# Patient Record
Sex: Male | Born: 1955 | Race: Black or African American | Hispanic: No | Marital: Married | Smoking: Never smoker
Health system: Southern US, Community
[De-identification: ages and names within clinical notes are randomized; demographics above are authoritative.]

## PROBLEM LIST (undated history)

## (undated) DIAGNOSIS — I10 Essential (primary) hypertension: Secondary | ICD-10-CM

## (undated) DIAGNOSIS — E7800 Pure hypercholesterolemia, unspecified: Secondary | ICD-10-CM

---

## 2004-12-17 ENCOUNTER — Emergency Department: Payer: Self-pay | Admitting: Emergency Medicine

## 2008-12-06 ENCOUNTER — Ambulatory Visit: Payer: Self-pay | Admitting: Unknown Physician Specialty

## 2017-09-05 DIAGNOSIS — J208 Acute bronchitis due to other specified organisms: Secondary | ICD-10-CM | POA: Diagnosis not present

## 2017-09-05 DIAGNOSIS — B9689 Other specified bacterial agents as the cause of diseases classified elsewhere: Secondary | ICD-10-CM | POA: Diagnosis not present

## 2017-10-06 DIAGNOSIS — I10 Essential (primary) hypertension: Secondary | ICD-10-CM | POA: Diagnosis not present

## 2017-10-06 DIAGNOSIS — E785 Hyperlipidemia, unspecified: Secondary | ICD-10-CM | POA: Diagnosis not present

## 2017-12-10 DIAGNOSIS — S46812A Strain of other muscles, fascia and tendons at shoulder and upper arm level, left arm, initial encounter: Secondary | ICD-10-CM | POA: Diagnosis not present

## 2018-02-14 DIAGNOSIS — E785 Hyperlipidemia, unspecified: Secondary | ICD-10-CM | POA: Diagnosis not present

## 2018-02-14 DIAGNOSIS — G4753 Recurrent isolated sleep paralysis: Secondary | ICD-10-CM | POA: Diagnosis not present

## 2018-02-14 DIAGNOSIS — I10 Essential (primary) hypertension: Secondary | ICD-10-CM | POA: Diagnosis not present

## 2018-02-14 DIAGNOSIS — Z23 Encounter for immunization: Secondary | ICD-10-CM | POA: Diagnosis not present

## 2018-04-10 DIAGNOSIS — N529 Male erectile dysfunction, unspecified: Secondary | ICD-10-CM | POA: Diagnosis not present

## 2018-04-10 DIAGNOSIS — Z1211 Encounter for screening for malignant neoplasm of colon: Secondary | ICD-10-CM | POA: Diagnosis not present

## 2018-04-10 DIAGNOSIS — Z23 Encounter for immunization: Secondary | ICD-10-CM | POA: Diagnosis not present

## 2018-04-10 DIAGNOSIS — Z0000 Encounter for general adult medical examination without abnormal findings: Secondary | ICD-10-CM | POA: Diagnosis not present

## 2018-05-17 DIAGNOSIS — N529 Male erectile dysfunction, unspecified: Secondary | ICD-10-CM | POA: Diagnosis not present

## 2018-06-05 DIAGNOSIS — G4733 Obstructive sleep apnea (adult) (pediatric): Secondary | ICD-10-CM | POA: Diagnosis not present

## 2018-12-10 ENCOUNTER — Other Ambulatory Visit: Payer: Self-pay

## 2018-12-10 ENCOUNTER — Emergency Department
Admission: EM | Admit: 2018-12-10 | Discharge: 2018-12-11 | Disposition: A | Discharge status: Home / Self Care | Payer: 59 | Attending: Emergency Medicine | Admitting: Emergency Medicine

## 2018-12-10 ENCOUNTER — Emergency Department: Payer: 59

## 2018-12-10 DIAGNOSIS — R0902 Hypoxemia: Secondary | ICD-10-CM | POA: Insufficient documentation

## 2018-12-10 DIAGNOSIS — Z20828 Contact with and (suspected) exposure to other viral communicable diseases: Secondary | ICD-10-CM | POA: Diagnosis not present

## 2018-12-10 DIAGNOSIS — I10 Essential (primary) hypertension: Secondary | ICD-10-CM | POA: Diagnosis not present

## 2018-12-10 DIAGNOSIS — R404 Transient alteration of awareness: Secondary | ICD-10-CM | POA: Diagnosis not present

## 2018-12-10 DIAGNOSIS — R000 Tachycardia, unspecified: Secondary | ICD-10-CM | POA: Diagnosis not present

## 2018-12-10 DIAGNOSIS — R55 Syncope and collapse: Secondary | ICD-10-CM | POA: Diagnosis not present

## 2018-12-10 DIAGNOSIS — W19XXXA Unspecified fall, initial encounter: Secondary | ICD-10-CM | POA: Diagnosis not present

## 2018-12-10 HISTORY — DX: Essential (primary) hypertension: I10

## 2018-12-10 HISTORY — DX: Pure hypercholesterolemia, unspecified: E78.00

## 2018-12-10 LAB — CBC
HCT: 47 % (ref 39.0–52.0)
Hemoglobin: 15.3 g/dL (ref 13.0–17.0)
MCH: 28.3 pg (ref 26.0–34.0)
MCHC: 32.6 g/dL (ref 30.0–36.0)
MCV: 86.9 fL (ref 80.0–100.0)
Platelets: 163 10*3/uL (ref 150–400)
RBC: 5.41 MIL/uL (ref 4.22–5.81)
RDW: 13.6 % (ref 11.5–15.5)
WBC: 8.4 10*3/uL (ref 4.0–10.5)
nRBC: 0 % (ref 0.0–0.2)

## 2018-12-10 LAB — TROPONIN I: Troponin I: <0.03 ng/mL (ref <0.03)

## 2018-12-10 LAB — BASIC METABOLIC PANEL
Anion gap: 15 (ref 5–15)
BUN: 17 mg/dL (ref 8–23)
CO2: 21 mmol/L — ABNORMAL LOW (ref 22–32)
Calcium: 9 mg/dL (ref 8.9–10.3)
Chloride: 104 mmol/L (ref 98–111)
Creatinine, Ser: 1.57 mg/dL — ABNORMAL HIGH (ref 0.61–1.24)
GFR calc Af Amer: 54 mL/min — ABNORMAL LOW (ref >60)
GFR calc non Af Amer: 47 mL/min — ABNORMAL LOW (ref >60)
Glucose, Bld: 194 mg/dL — ABNORMAL HIGH (ref 70–99)
Potassium: 3.5 mmol/L (ref 3.5–5.1)
Sodium: 140 mmol/L (ref 135–145)

## 2018-12-10 LAB — SARS CORONAVIRUS 2 BY RT PCR (HOSPITAL ORDER, PERFORMED IN ~~LOC~~ HOSPITAL LAB): SARS Coronavirus 2: NEGATIVE

## 2018-12-10 MED ORDER — SODIUM CHLORIDE 0.9 % IV BOLUS
1000.0000 mL | Freq: Once | INTRAVENOUS | Status: AC
  Administered 2018-12-10: 1000 mL via INTRAVENOUS

## 2018-12-10 NOTE — ED Provider Notes (Signed)
[facility] Emergency Department Provider Note ____________________________________________   First MD Initiated Contact with Patient 12/10/18 2048     (approximate)  I have reviewed the triage vital signs and the nursing notes.   HISTORY  Chief Complaint Near Syncope    HPI Robert Houston is a 63 y.o. male with PMH as noted below who presents with syncope, acute onset after the patient was doing sit ups and push-ups with his daughter.  The patient states that he did not feel any prodrome of lightheadedness, and simply awoke with the ambulance there.  He denies any prior history of this.  He states that he sometimes feels short of breath with exertion, but denies any chest pain.  He denies any shortness of breath at this time, or any cough or fever.  He has no vomiting, but states he did have some diarrhea earlier today.  EMS reported that the patient awoke after he got Narcan, but the patient denies any illicit drug use.  Past Medical History:  Diagnosis Date  . Hypercholesteremia   . Hypertension     There are no active problems to display for this patient.   History reviewed. No pertinent surgical history.  Prior to Admission medications   Not on File    Allergies Tomato  History reviewed. No pertinent family history.  Social History Social History   Tobacco Use  . Smoking status: Never Smoker  . Smokeless tobacco: Never Used  Substance Use Topics  . Alcohol use: Yes    Alcohol/week: 2.0 standard drinks    Types: 2 Shots of liquor per week  . Drug use: Not Currently    Review of Systems  Constitutional: No fever. Eyes: No visual changes. ENT: No sore throat. Cardiovascular: Denies chest pain. Respiratory: Denies shortness of breath. Gastrointestinal: No vomiting.  Positive for resolved diarrhea. Genitourinary: Negative for dysuria.  Musculoskeletal: Negative for back pain. Skin: Negative for rash. Neurological: Negative for  headache.   ____________________________________________   PHYSICAL EXAM:  VITAL SIGNS: ED Triage Vitals  Enc Vitals Group     BP 12/10/18 2040 123/66     Pulse Rate 12/10/18 2040 90     Resp 12/10/18 2040 18     Temp 12/10/18 2040 98.1 F (36.7 C)     Temp Source 12/10/18 2040 Oral     SpO2 12/10/18 2040 93 %     Weight 12/10/18 2035 238 lb (108 kg)     Height 12/10/18 2035  (1.727 m)     Head Circumference --      Peak Flow --      Pain Score 12/10/18 2035 0     Pain Loc --      Pain Edu? --      Excl. in GC? --     Constitutional: Alert and oriented. Well appearing and in no acute distress. Eyes: Conjunctivae are normal.  Head: Atraumatic. Nose: No congestion/rhinnorhea. Mouth/Throat: Mucous membranes are slightly dry.   Neck: Normal range of motion.  Cardiovascular: Normal rate, regular rhythm. Grossly normal heart sounds.  Good peripheral circulation. Respiratory: Normal respiratory effort.  No retractions. Lungs CTAB. Gastrointestinal: Soft and nontender. No distention.  Genitourinary: No flank tenderness. Musculoskeletal: No lower extremity edema.  Extremities warm and well perfused.  Neurologic:  Normal speech and language.  Motor intact in all extremities.  Normal coordination.  No gross focal neurologic deficits are appreciated.  Skin:  Skin is warm and dry. No rash noted. Psychiatric: Mood and  affect are normal. Speech and behavior are normal.  ____________________________________________   LABS (all labs ordered are listed, but only abnormal results are displayed)  Labs Reviewed  BASIC METABOLIC PANEL - Abnormal; Notable for the following components:      Result Value   CO2 21 (*)    Glucose, Bld 194 (*)    Creatinine, Ser 1.57 (*)    GFR calc non Af Amer 47 (*)    GFR calc Af Amer 54 (*)    All other components within normal limits  SARS CORONAVIRUS 2 (HOSPITAL ORDER, PERFORMED IN [facility])  CBC  TROPONIN I  URINALYSIS,  COMPLETE (UACMP) WITH MICROSCOPIC  URINE DRUG SCREEN, QUALITATIVE ([facility] ONLY)  CBG MONITORING, ED   ____________________________________________  EKG  ED ECG REPORT I, Dionne BucySebastian Claudetta Sallie, the attending physician, personally viewed and interpreted this ECG.  Date: 12/10/2018 EKG Time: 2037 Rate: 97 Rhythm: normal sinus rhythm QRS Axis: normal Intervals: normal ST/T Wave abnormalities: normal Narrative Interpretation: no evidence of acute ischemia  ____________________________________________  RADIOLOGY  CXR: No acute abnormality CT angio chest: Pending ____________________________________________   PROCEDURES  Procedure(s) performed: No  Procedures  Critical Care performed: No ____________________________________________   INITIAL IMPRESSION / ASSESSMENT AND PLAN / ED COURSE  Pertinent labs & imaging results that were available during my care of the patient were reviewed by me and considered in my medical decision making (see chart for details).  63 year old male with PMH as noted above presents with a syncopal episode after he was exercising with his daughter.  Per EMS, the patient awoke after he was given Narcan although he denies any drug use.  He states he is feeling better, and is asymptomatic at this time.  He denies any significant prodromal symptoms.  He states he sometimes feels short of breath with exercise but has no respiratory symptoms at this time.  I reviewed the past medical records in epic; the patient has no ED visits or admissions here previously.  On exam he is well-appearing.  His vital signs are normal except for borderline low O2 saturation in the low 90s on room air.  The remainder the exam is unremarkable.  Neuro exam is nonfocal.  His EKG is normal.  Overall presentation is consistent with vasovagal near syncope although the lack of a prodrome concerns me somewhat.  The EKG has no findings to suggest cardiac etiology.  It is also possible  that the patient was somewhat dehydrated from having diarrhea earlier, this was exacerbated by exercise.  We will obtain lab work-up, give fluids, and reassess.  ----------------------------------------- 10:27 PM on 12/10/2018 -----------------------------------------  When I initially saw the patient he was on 2 L of oxygen turned on by EMS.  I turned this off and observe the patient for a few minutes and his O2 saturation stayed in the low to mid 90s.  However the saturations subsequently started to have dips into the high 80s repeatedly and with a good waveform.  The patient is fully awake and alert.  He still has no active symptoms.  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  His symptoms are not really consistent with COVID-19, but given the hypoxia I will test him.  We will also obtain a chest x-ray and reassess.  If he continues to have recurrent hypoxia, especially in the context of an exertional syncope, I will consider admitting him.  ----------------------------------------- 11:18 PM on  12/10/2018 -----------------------------------------  Chest x-ray shows no acute abnormality.  Given the combination of syncope with persistent mild hypoxia, I will obtain a CT chest to rule out PE.  I discussed with the patient that if his O2 saturation remains intermittently low, I would strongly recommend admission for further evaluation especially in the context of exertional syncope.  Otherwise we cannot rule out possible cardiopulmonary etiology of his symptoms today.  The patient states he would like to think about it and wait for the results of the additional tests.  He is pending CT chest and COVID-19 test at this time.  I signed the patient out to the oncoming physician Dr. Derrill Kay.   __________________________________  Cathlean Marseilles was evaluated in Emergency Department on 12/10/2018 for the symptoms  described in the history of present illness. He was evaluated in the context of the global COVID-19 pandemic, which necessitated consideration that the patient might be at risk for infection with the SARS-CoV-2 virus that causes COVID-19. Institutional protocols and algorithms that pertain to the evaluation of patients at risk for COVID-19 are in a state of rapid change based on information released by regulatory bodies including the CDC and federal and state organizations. These policies and algorithms were followed during the patient's care in the ED.   ____________________________________________   FINAL CLINICAL IMPRESSION(S) / ED DIAGNOSES  Final diagnoses:  Syncope, unspecified syncope type  Hypoxia      NEW MEDICATIONS STARTED DURING THIS VISIT:  New Prescriptions   No medications on file     Note:  This document was prepared using Dragon voice recognition software and may include unintentional dictation errors.    Dionne Bucy, MD 12/10/18 [phone]

## 2018-12-10 NOTE — ED Triage Notes (Signed)
Pt arrived via [facility] c/o syncope. Pt states that he did some sit ups and push ups and was exercising when all of a sudden he passed out and does not remember anything. EMS states that they gave him narcan and he then became more alert and oriented. Pt pleasant and alert on arrival.

## 2018-12-10 NOTE — ED Notes (Signed)
Pt oxygen saturation reading 85-88% on RA. Pt sitting up in bed alert and oriented. EDP made aware.

## 2018-12-11 ENCOUNTER — Encounter: Payer: Self-pay | Admitting: Radiology

## 2018-12-11 ENCOUNTER — Emergency Department: Payer: 59

## 2018-12-11 DIAGNOSIS — R4182 Altered mental status, unspecified: Secondary | ICD-10-CM | POA: Diagnosis not present

## 2018-12-11 DIAGNOSIS — R69 Illness, unspecified: Secondary | ICD-10-CM | POA: Diagnosis not present

## 2018-12-11 DIAGNOSIS — I639 Cerebral infarction, unspecified: Secondary | ICD-10-CM | POA: Diagnosis not present

## 2018-12-11 LAB — URINALYSIS, COMPLETE (UACMP) WITH MICROSCOPIC
Bacteria, UA: NONE SEEN
Bilirubin Urine: NEGATIVE
Glucose, UA: NEGATIVE mg/dL
Hgb urine dipstick: NEGATIVE
Ketones, ur: 5 mg/dL — AB
Leukocytes,Ua: NEGATIVE
Nitrite: NEGATIVE
Protein, ur: NEGATIVE mg/dL
Specific Gravity, Urine: 1.015 (ref 1.005–1.030)
Squamous Epithelial / HPF: NONE SEEN (ref 0–5)
pH: 5 (ref 5.0–8.0)

## 2018-12-11 LAB — URINE DRUG SCREEN, QUALITATIVE (ARMC ONLY)
Amphetamines, Ur Screen: NOT DETECTED
Barbiturates, Ur Screen: NOT DETECTED
Benzodiazepine, Ur Scrn: NOT DETECTED
Cannabinoid 50 Ng, Ur ~~LOC~~: NOT DETECTED
Cocaine Metabolite,Ur ~~LOC~~: NOT DETECTED
MDMA (Ecstasy)Ur Screen: NOT DETECTED
Methadone Scn, Ur: NOT DETECTED
Opiate, Ur Screen: NOT DETECTED
Phencyclidine (PCP) Ur S: NOT DETECTED
Tricyclic, Ur Screen: NOT DETECTED

## 2018-12-11 MED ORDER — IOHEXOL 350 MG/ML SOLN
75.0000 mL | Freq: Once | INTRAVENOUS | Status: AC | PRN
  Administered 2018-12-11: 75 mL via INTRAVENOUS

## 2018-12-11 NOTE — ED Notes (Signed)
ED Provider at bedside. 

## 2018-12-11 NOTE — ED Notes (Signed)
Patient transported to CT 

## 2018-12-11 NOTE — Discharge Instructions (Addendum)
As we discussed it is very important that you follow up with your primary care physician. It is important that they follow up with you to make sure that you are feeling okay and do not have any further syncopal episodes, weakness or shortness of breath. Please seek medical attention for any high fevers, chest pain, shortness of breath, change in behavior, persistent vomiting, bloody stool or any other new or concerning symptoms.

## 2018-12-11 NOTE — ED Provider Notes (Signed)
CT angio PE was negative. COVID negative. Patient did have a couple further episodes of hypoxia into the upper 80s. However it would only last a second or 2 before rising back into the 90s. Did get ambulatory saturation without levels dropping below 90s. Patient continued to feel better. Did request discharge. At this time I think it is reasonable. Did however discuss with patient importance of close PCP follow up.   Phineas Semen, MD 12/11/18 [phone]

## 2018-12-11 NOTE — ED Notes (Signed)
Walked with pt around the room while monitoring pt's pulse/ox as requested by Dr. Derrill Kay. Reported observations to Dr. Derrill Kay.

## 2018-12-13 DIAGNOSIS — R0902 Hypoxemia: Secondary | ICD-10-CM | POA: Diagnosis not present

## 2018-12-13 DIAGNOSIS — G9340 Encephalopathy, unspecified: Secondary | ICD-10-CM | POA: Diagnosis not present

## 2018-12-27 DIAGNOSIS — R569 Unspecified convulsions: Secondary | ICD-10-CM | POA: Diagnosis not present

## 2018-12-27 DIAGNOSIS — R0902 Hypoxemia: Secondary | ICD-10-CM | POA: Diagnosis not present

## 2018-12-27 DIAGNOSIS — G9340 Encephalopathy, unspecified: Secondary | ICD-10-CM | POA: Diagnosis not present

## 2020-06-25 IMAGING — CR PORTABLE CHEST - 1 VIEW
1 series · 1 of 1 positions shown · non-contrast
Comparison: None.

CLINICAL DATA: Syncope and hypoxia today.

EXAM:
PORTABLE CHEST 1 VIEW

[chest ap]
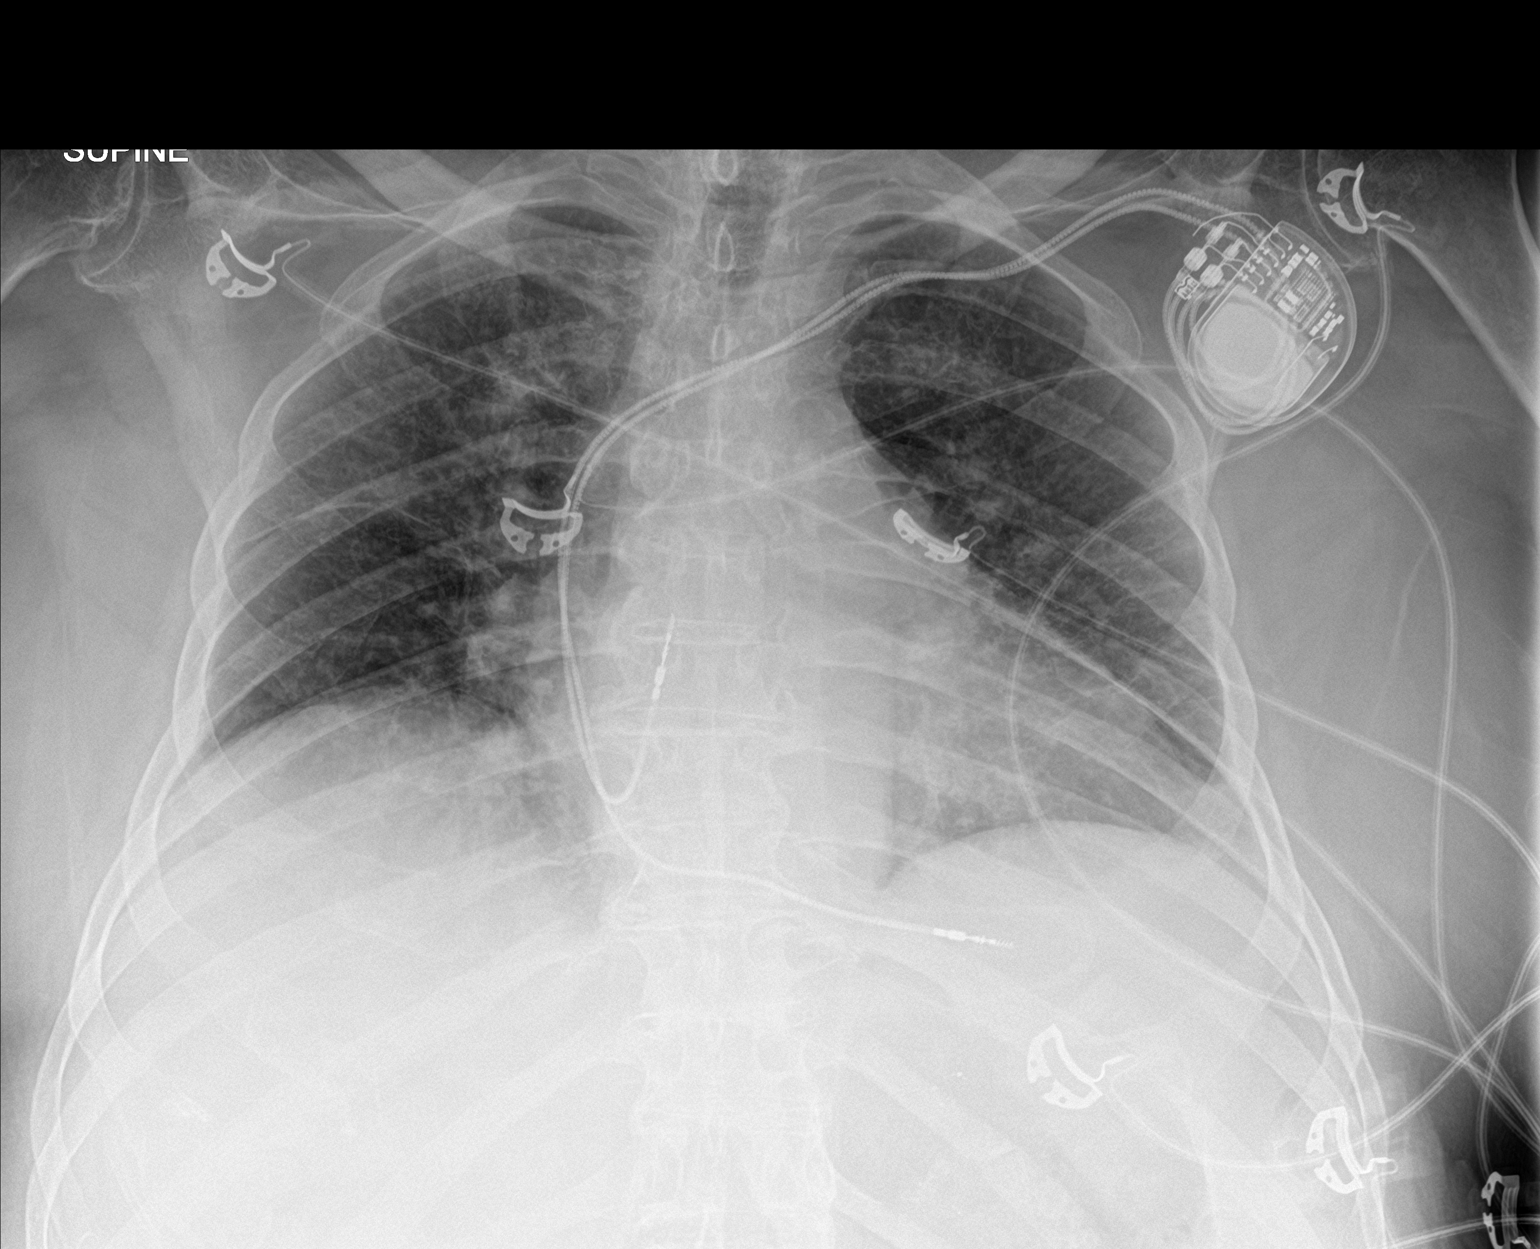

[1 of 1 positions shown; findings below may reference images not displayed]

FINDINGS: Lungs clear. Heart size normal. No pneumothorax or pleural fluid. No
acute or focal bony abnormality.
IMPRESSION: Negative chest.

## 2020-06-26 IMAGING — CT CT ANGIOGRAPHY CHEST
2 of 6 series · 19 of 36 positions shown · IV contrast (APPLIED)
Comparison: Chest radiograph dated 12/10/2018

CLINICAL DATA: 62-year-old male with concern for pulmonary
embolism.

EXAM:
CT ANGIOGRAPHY CHEST WITH CONTRAST
TECHNIQUE: Multidetector CT imaging of the chest was performed using the
standard protocol during bolus administration of intravenous
contrast. Multiplanar CT image reconstructions and MIPs were
obtained to evaluate the vascular anatomy.
CONTRAST:  75mL OMNIPAQUE IOHEXOL 350 MG/ML SOLN

[Series 5: thins · axial · 0.70mm/px · z∈[-262,-6]mm · 18 of 287 slices shown]
[im 15/287  lung]
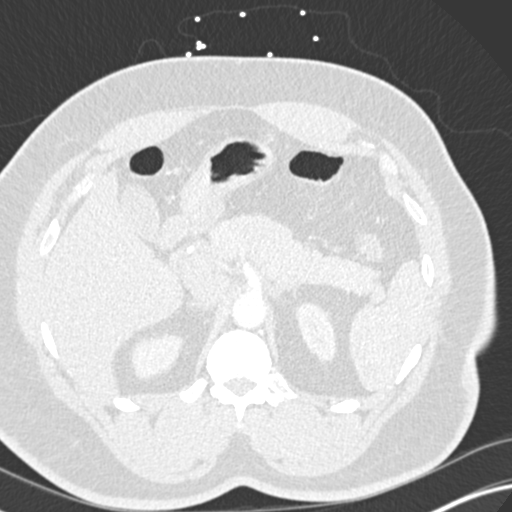
[im 29/287  mediastinal]
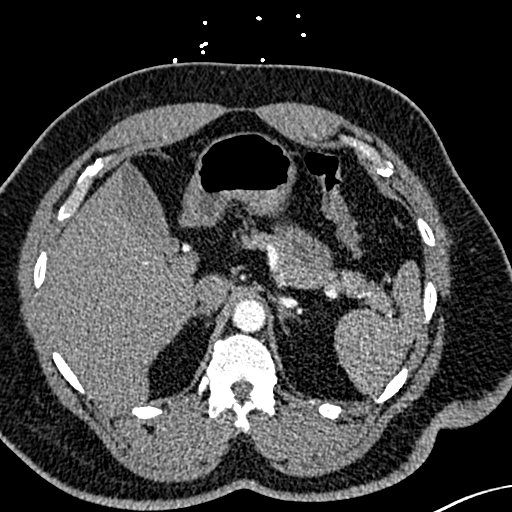
[im 43/287  lung]
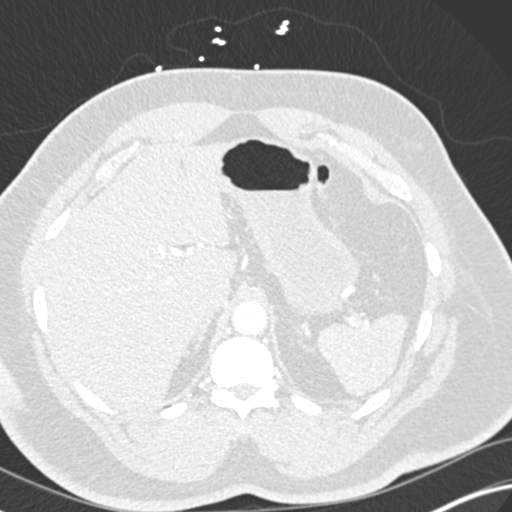
[im 58/287  mediastinal]
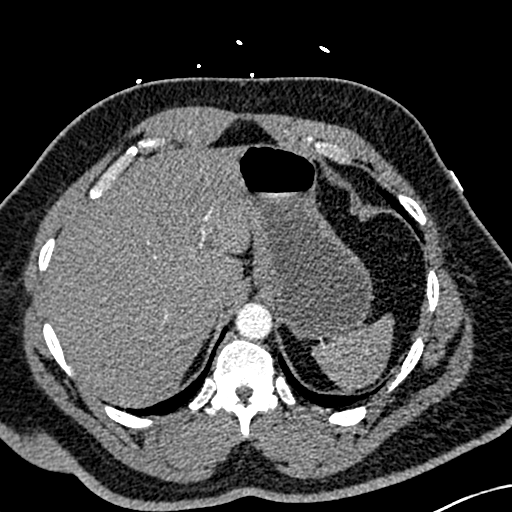
[im 72/287  lung]
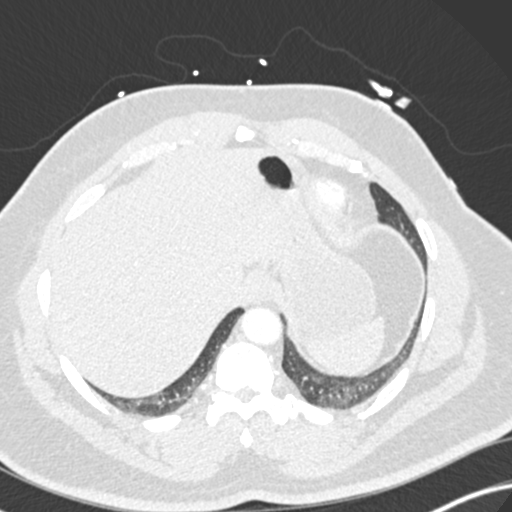
[im 86/287  mediastinal]
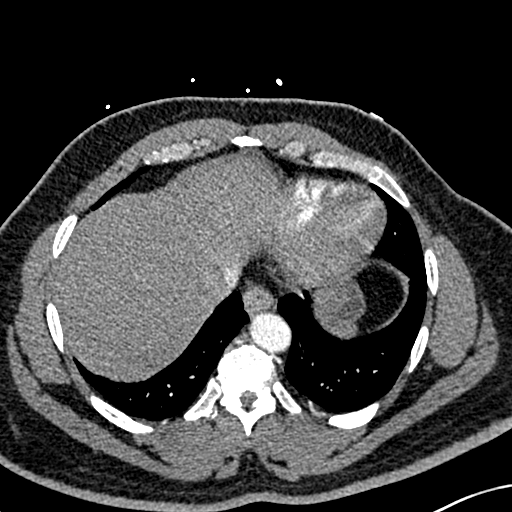
[im 101/287  lung]
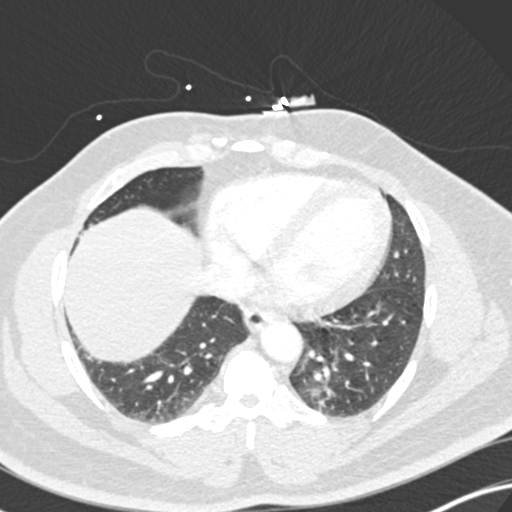
[im 115/287  mediastinal]
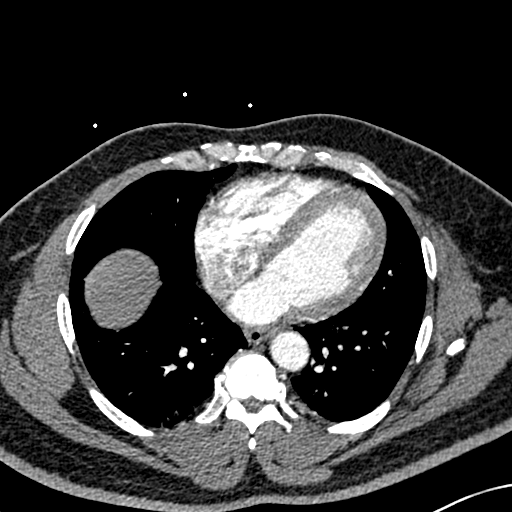
[im 129/287  lung]
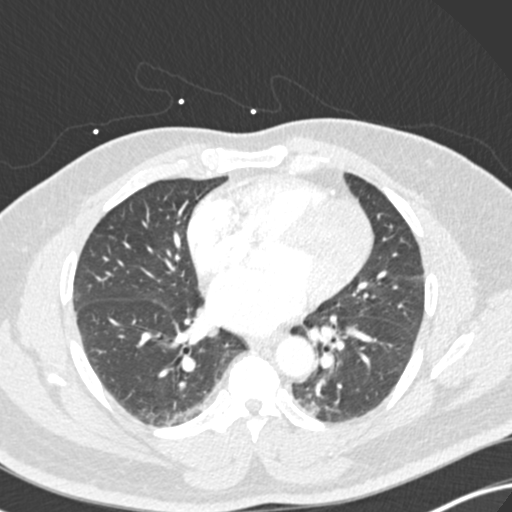
[im 158/287  mediastinal]
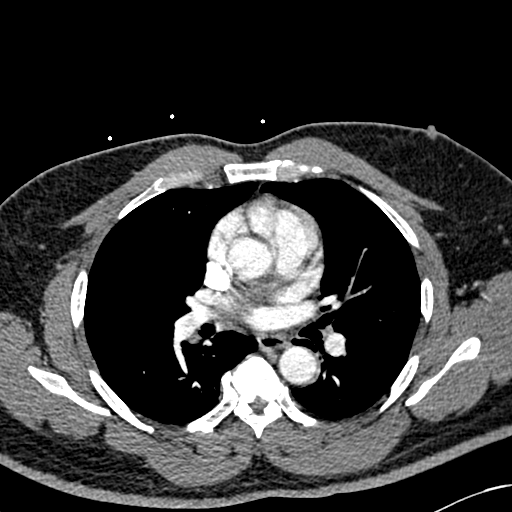
[im 172/287  lung]
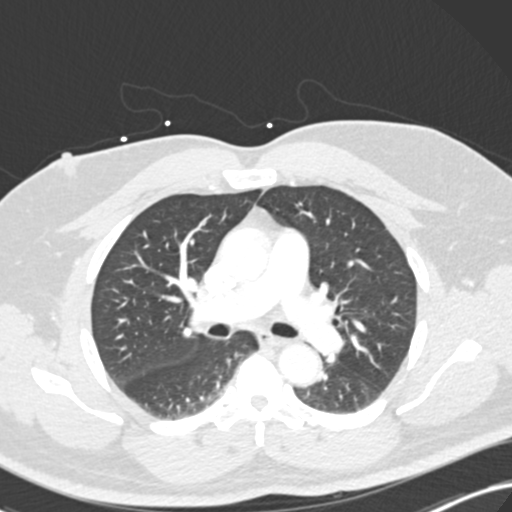
[im 186/287  mediastinal]
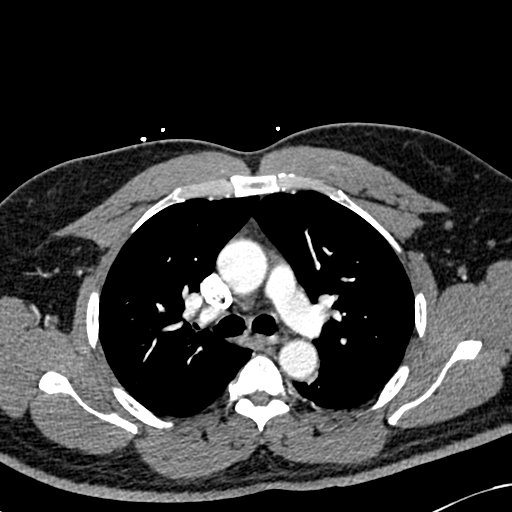
[im 201/287  lung]
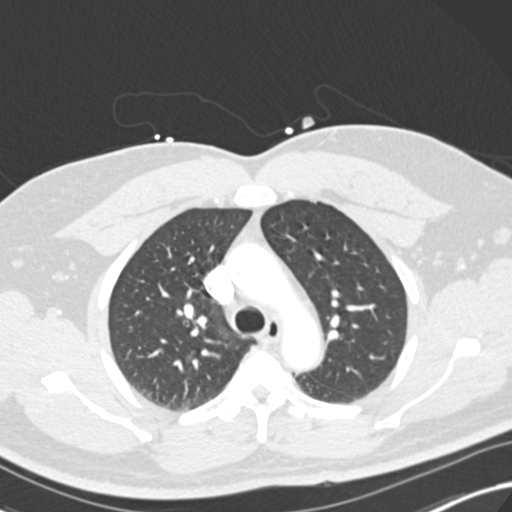
[im 215/287  mediastinal]
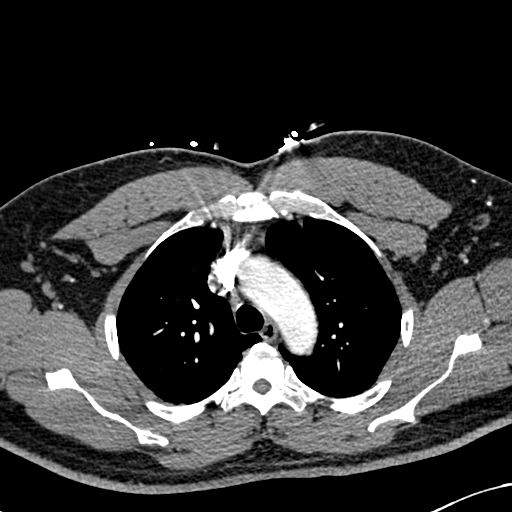
[im 229/287  lung]
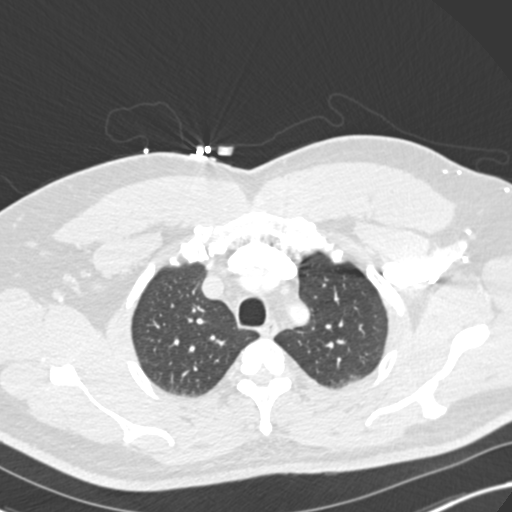
[im 244/287  mediastinal]
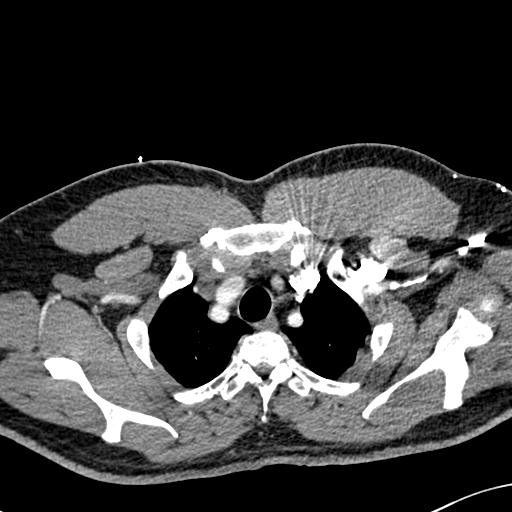
[im 258/287  lung]
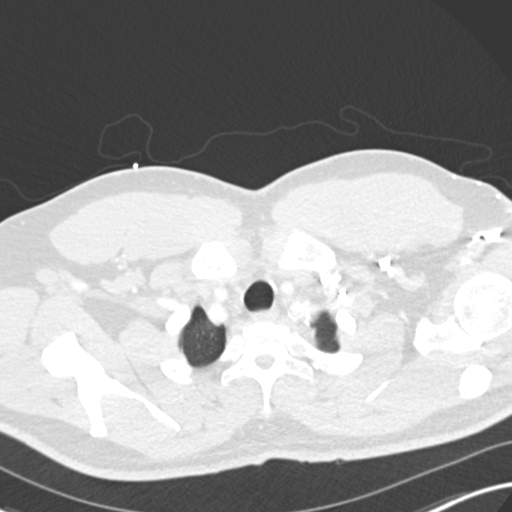
[im 272/287  mediastinal]
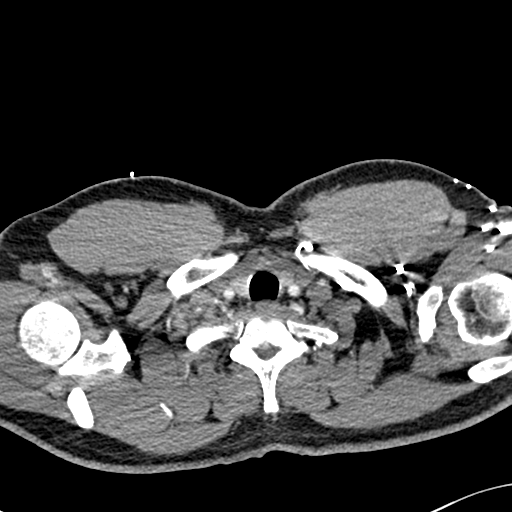

[Series 7: coronal mpr · coronal · 0.59mm/px · 1 of 90 slices shown]
[im 45/90  mediastinal]
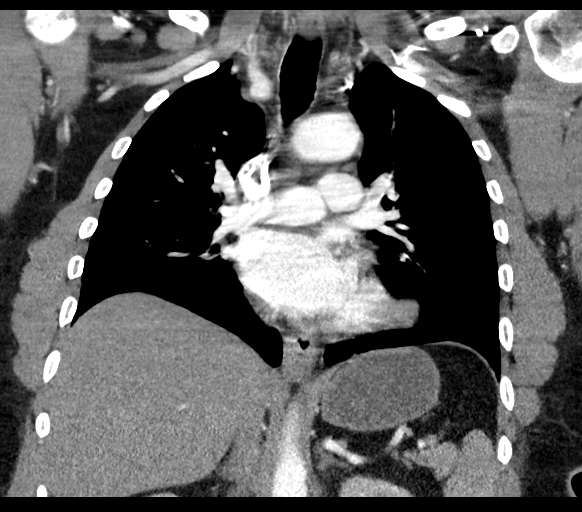

[19 of 36 positions shown; findings below may reference images not displayed]

FINDINGS: Cardiovascular: There is no cardiomegaly or pericardial effusion.
The thoracic aorta is unremarkable. The origins of the great vessels
of the aortic arch are patent. Evaluation of the pulmonary arteries
is limited due to respiratory motion artifact and suboptimal
opacification of the peripheral branches. No large or central
pulmonary artery embolus identified.

Mediastinum/Nodes: There is no hilar or mediastinal adenopathy. The
esophagus and the thyroid gland are grossly unremarkable. No
mediastinal fluid collection.

Lungs/Pleura: Minimal bibasilar atelectatic changes. There is no
focal consolidation, pleural effusion, or pneumothorax. The central
airways are patent.

Upper Abdomen: No acute abnormality.

Musculoskeletal: No chest wall abnormality. No acute or significant
osseous findings.

Review of the MIP images confirms the above findings.
IMPRESSION: No acute intrathoracic pathology. No CT evidence of central
pulmonary artery embolus.

## 2022-07-30 ENCOUNTER — Emergency Department (HOSPITAL_COMMUNITY)
Admission: EM | Admit: 2022-07-30 | Discharge: 2022-07-31 | Disposition: A | Discharge status: Home / Self Care | Payer: Commercial Managed Care - PPO | Attending: Student | Admitting: Student

## 2022-07-30 ENCOUNTER — Encounter (HOSPITAL_COMMUNITY): Payer: Self-pay | Admitting: Emergency Medicine

## 2022-07-30 ENCOUNTER — Other Ambulatory Visit: Payer: Self-pay

## 2022-07-30 DIAGNOSIS — R140 Abdominal distension (gaseous): Secondary | ICD-10-CM | POA: Insufficient documentation

## 2022-07-30 DIAGNOSIS — Z1152 Encounter for screening for COVID-19: Secondary | ICD-10-CM | POA: Insufficient documentation

## 2022-07-30 DIAGNOSIS — I10 Essential (primary) hypertension: Secondary | ICD-10-CM | POA: Diagnosis not present

## 2022-07-30 DIAGNOSIS — R0602 Shortness of breath: Secondary | ICD-10-CM | POA: Diagnosis not present

## 2022-07-30 DIAGNOSIS — R4182 Altered mental status, unspecified: Secondary | ICD-10-CM | POA: Insufficient documentation

## 2022-07-30 DIAGNOSIS — T50901A Poisoning by unspecified drugs, medicaments and biological substances, accidental (unintentional), initial encounter: Secondary | ICD-10-CM

## 2022-07-30 DIAGNOSIS — R000 Tachycardia, unspecified: Secondary | ICD-10-CM | POA: Diagnosis not present

## 2022-07-30 DIAGNOSIS — R600 Localized edema: Secondary | ICD-10-CM | POA: Diagnosis not present

## 2022-07-30 NOTE — ED Triage Notes (Signed)
Pt presents from friend's house where he became unresponsive at 2200. His friends called EMS who arrived to pt snoring respirations and pinpoint pupils. A code stroke was initiated but canceled after pt became responsive immediately after receiving .5mg  narcan. EMS VS: 200/120, 83% RA (93% on NRB), 130-bpm, 10RR (improved t0 18 after narcan)

## 2022-07-31 ENCOUNTER — Emergency Department (HOSPITAL_COMMUNITY): Payer: Commercial Managed Care - PPO

## 2022-07-31 DIAGNOSIS — R4182 Altered mental status, unspecified: Secondary | ICD-10-CM | POA: Diagnosis not present

## 2022-07-31 LAB — RAPID URINE DRUG SCREEN, HOSP PERFORMED
Amphetamines: NOT DETECTED
Barbiturates: NOT DETECTED
Benzodiazepines: NOT DETECTED
Cocaine: POSITIVE — AB
Opiates: NOT DETECTED
Tetrahydrocannabinol: NOT DETECTED

## 2022-07-31 LAB — RESP PANEL BY RT-PCR (RSV, FLU A&B, COVID)  RVPGX2
Influenza A by PCR: NEGATIVE
Influenza B by PCR: NEGATIVE
Resp Syncytial Virus by PCR: NEGATIVE
SARS Coronavirus 2 by RT PCR: NEGATIVE

## 2022-07-31 LAB — COMPREHENSIVE METABOLIC PANEL
ALT: 60 U/L — ABNORMAL HIGH (ref 0–44)
AST: 45 U/L — ABNORMAL HIGH (ref 15–41)
Albumin: 3.7 g/dL (ref 3.5–5.0)
Alkaline Phosphatase: 52 U/L (ref 38–126)
Anion gap: 10 (ref 5–15)
BUN: 15 mg/dL (ref 8–23)
CO2: 23 mmol/L (ref 22–32)
Calcium: 8.7 mg/dL — ABNORMAL LOW (ref 8.9–10.3)
Chloride: 104 mmol/L (ref 98–111)
Creatinine, Ser: 1.49 mg/dL — ABNORMAL HIGH (ref 0.61–1.24)
GFR, Estimated: 52 mL/min — ABNORMAL LOW (ref >60)
Glucose, Bld: 257 mg/dL — ABNORMAL HIGH (ref 70–99)
Potassium: 4.2 mmol/L (ref 3.5–5.1)
Sodium: 137 mmol/L (ref 135–145)
Total Bilirubin: 0.8 mg/dL (ref 0.3–1.2)
Total Protein: 6.8 g/dL (ref 6.5–8.1)

## 2022-07-31 LAB — CBC WITH DIFFERENTIAL/PLATELET
Abs Immature Granulocytes: 0.05 10*3/uL (ref 0.00–0.07)
Basophils Absolute: 0 10*3/uL (ref 0.0–0.1)
Basophils Relative: 0 %
Eosinophils Absolute: 0.1 10*3/uL (ref 0.0–0.5)
Eosinophils Relative: 2 %
HCT: 44.1 % (ref 39.0–52.0)
Hemoglobin: 14.1 g/dL (ref 13.0–17.0)
Immature Granulocytes: 1 %
Lymphocytes Relative: 14 %
Lymphs Abs: 1.1 10*3/uL (ref 0.7–4.0)
MCH: 27.3 pg (ref 26.0–34.0)
MCHC: 32 g/dL (ref 30.0–36.0)
MCV: 85.5 fL (ref 80.0–100.0)
Monocytes Absolute: 0.4 10*3/uL (ref 0.1–1.0)
Monocytes Relative: 6 %
Neutro Abs: 5.8 10*3/uL (ref 1.7–7.7)
Neutrophils Relative %: 77 %
Platelets: 148 10*3/uL — ABNORMAL LOW (ref 150–400)
RBC: 5.16 MIL/uL (ref 4.22–5.81)
RDW: 13.8 % (ref 11.5–15.5)
WBC: 7.5 10*3/uL (ref 4.0–10.5)
nRBC: 0 % (ref 0.0–0.2)

## 2022-07-31 LAB — TROPONIN I (HIGH SENSITIVITY): Troponin I (High Sensitivity): 10 ng/L (ref <18)

## 2022-07-31 LAB — BRAIN NATRIURETIC PEPTIDE: B Natriuretic Peptide: 51.2 pg/mL (ref 0.0–100.0)

## 2022-07-31 NOTE — ED Notes (Signed)
Aware of need for urine sample, urinal and call light at bedside

## 2022-07-31 NOTE — ED Provider Notes (Signed)
[facility] EMERGENCY DEPARTMENT Provider Note  CSN: [id number] Arrival date & time: 07/30/22 2322  Chief Complaint(s) Altered Mental Status  HPI Robert Houston is a 66 y.o. male with PMH HTN, HLD who presents emergency department for evaluation of altered mental status and shortness of breath.  History attained from patient and patient's family who states that approximately 2200 on 07/30/2022 the patient went unresponsive with word finding difficulty.  Patient does admit to snorting a white powder that he thought was cocaine and EMS initially had concern for stroke but found the patient to have pinpoint pupils and administered Narcan.  Patient's symptoms resolved immediately afterwards and stroke alert was canceled prior to arrival to the ER.  Additional history obtained from patient's wife who states that over the last 3 to 4 days patient has had progressive worsening swelling of his lower extremities and exertional shortness of breath with orthopnea.  Patient currently denies chest pain, shortness of breath, headache, fever or other systemic symptoms.   Past Medical History Past Medical History:  Diagnosis Date   Hypercholesteremia    Hypertension    There are no problems to display for this patient.  Home Medication(s) Prior to Admission medications   Not on File                                                                                                                                    Past Surgical History History reviewed. No pertinent surgical history. Family History History reviewed. No pertinent family history.  Social History Social History   Tobacco Use   Smoking status: Never   Smokeless tobacco: Never  Substance Use Topics   Alcohol use: Yes    Alcohol/week: 2.0 standard drinks of alcohol    Types: 2 Shots of liquor per week   Drug use: Not Currently   Allergies Tomato  Review of Systems Review of Systems  Respiratory:  Positive for  shortness of breath.   Cardiovascular:  Positive for leg swelling.  Neurological:  Positive for syncope.    Physical Exam Vital Signs  I have reviewed the triage vital signs BP (!) 166/78 (BP Location: Right Arm)   Pulse 99   Temp 98.3 F (36.8 C) (Oral)   Resp 19   Ht 5\' 8"  (1.727 m)   Wt 117.5 kg   SpO2 95%   BMI 39.38 kg/m   Physical Exam Constitutional:      General: He is not in acute distress.    Appearance: Normal appearance.  HENT:     Head: Normocephalic and atraumatic.     Nose: No congestion or rhinorrhea.  Eyes:     General:        Right eye: No discharge.        Left eye: No discharge.     Extraocular Movements: Extraocular movements intact.     Pupils: Pupils are equal, round, and reactive to light.  Cardiovascular:     Rate and Rhythm: Normal rate and regular rhythm.     Heart sounds: No murmur heard. Pulmonary:     Effort: No respiratory distress.     Breath sounds: No wheezing or rales.  Abdominal:     General: There is distension.     Tenderness: There is no abdominal tenderness.  Musculoskeletal:        General: Normal range of motion.     Cervical back: Normal range of motion.     Right lower leg: Edema present.     Left lower leg: Edema present.  Skin:    General: Skin is warm and dry.  Neurological:     General: No focal deficit present.     Mental Status: He is alert.     ED Results and Treatments Labs (all labs ordered are listed, but only abnormal results are displayed) Labs Reviewed  CBC WITH DIFFERENTIAL/PLATELET - Abnormal; Notable for the following components:      Result Value   Platelets 148 (*)    All other components within normal limits  RESP PANEL BY RT-PCR (RSV, FLU A&B, COVID)  RVPGX2  COMPREHENSIVE METABOLIC PANEL  RAPID URINE DRUG SCREEN, HOSP PERFORMED  BRAIN NATRIURETIC PEPTIDE  TROPONIN I (HIGH SENSITIVITY)                                                                                                                           Radiology No results found.  Pertinent labs & imaging results that were available during my care of the patient were reviewed by me and considered in my medical decision making (see MDM for details).  Medications Ordered in ED Medications - No data to display                                                                                                                                   Procedures Procedures  (including critical care time)  Medical Decision Making / ED Course   This patient presents to the ED for concern of altered mental status/syncope, unintentional overdose, this involves an extensive number of treatment options, and is a complaint that carries with it a high risk of complications and morbidity.  The differential diagnosis includes unintentional overdose on opioids, electrolyte abnormality, CHF, viral URI, abdominal obstruction  MDM: Patient seen emergency room for evaluation of an episode of transient alteration of  awareness and concern for unintentional overdose.  On arrival to the ER, patient not complaining of any numbness, tingling, weakness or any other neurologic complaints and neurologic exam is normal.  Physical exam showed some mild abdominal distention but is otherwise unremarkable.  Laboratory evaluation with a creatinine of 1.49 which is baseline for this patient, AST 45, ALT 60.  COVID and flu negative.  Chest x-ray and KUB unremarkable.  Urine drug screen obtained in the setting of suspected unintentional overdose and this was positive for cocaine but would not be sensitive enough to pick up fentanyl and I suspect he likely had an unintentional fentanyl overdose given his significant response to Narcan and pinpoint pupils found in the field.  Patient was observed in the emergency department for approximately 2 and half hours with no return of symptoms.  I counseled the patient on cessation of cocaine use and I have very low suspicion the  patient had a stroke today.  Patient then discharged with outpatient follow-up   Additional history obtained: -Additional history obtained from multiple family members -External records from outside source obtained and reviewed including: Chart review including previous notes, labs, imaging, consultation notes   Lab Tests: -I ordered, reviewed, and interpreted labs.   The pertinent results include:   Labs Reviewed  CBC WITH DIFFERENTIAL/PLATELET - Abnormal; Notable for the following components:      Result Value   Platelets 148 (*)    All other components within normal limits  RESP PANEL BY RT-PCR (RSV, FLU A&B, COVID)  RVPGX2  COMPREHENSIVE METABOLIC PANEL  RAPID URINE DRUG SCREEN, HOSP PERFORMED  BRAIN NATRIURETIC PEPTIDE  TROPONIN I (HIGH SENSITIVITY)      EKG   EKG Interpretation  Date/Time:  Saturday July 30 2022 23:28:49 EST Ventricular Rate:  105 PR Interval:  145 QRS Duration: 82 QT Interval:  345 QTC Calculation: 456 R Axis:   68 Text Interpretation: Sinus tachycardia Borderline T wave abnormalities Confirmed by Gavan Nordby ([id number]) on 07/31/2022 2:06:11 AM         Imaging Studies ordered: I ordered imaging studies including chest x-ray, KUB I independently visualized and interpreted imaging. I agree with the radiologist interpretation   Medicines ordered and prescription drug management: No orders of the defined types were placed in this encounter.   -I have reviewed the patients home medicines and have made adjustments as needed  Critical interventions none    Cardiac Monitoring: The patient was maintained on a cardiac monitor.  I personally viewed and interpreted the cardiac monitored which showed an underlying rhythm of: NSR  Social Determinants of Health:  Factors impacting patients care include: Cocaine use   Reevaluation: After the interventions noted above, I reevaluated the patient and found that they have :improved  Co  morbidities that complicate the patient evaluation  Past Medical History:  Diagnosis Date   Hypercholesteremia    Hypertension       Dispostion: I considered admission for this patient, and at this time he does not meet inpatient criteria and is safe for discharge to outpatient follow-up     Final Clinical Impression(s) / ED Diagnoses Final diagnoses:  None     @PCDICTATION @    , MD 07/31/22 [phone]
